# Patient Record
Sex: Male | Born: 1945 | Race: White | Hispanic: No | Smoking: Current every day smoker
Health system: Southern US, Community
[De-identification: ages and names within clinical notes are randomized; demographics above are authoritative.]

---

## 2010-02-06 ENCOUNTER — Emergency Department (HOSPITAL_COMMUNITY): Admission: EM | Admit: 2010-02-06 | Discharge: 2010-02-06 | Payer: Self-pay | Admitting: Emergency Medicine

## 2011-07-30 ENCOUNTER — Emergency Department (HOSPITAL_COMMUNITY)
Admission: EM | Admit: 2011-07-30 | Discharge: 2011-07-30 | Disposition: A | Discharge status: Home / Self Care | Payer: Medicare Other | Attending: Emergency Medicine | Admitting: Emergency Medicine

## 2011-07-30 ENCOUNTER — Encounter (HOSPITAL_COMMUNITY): Payer: Self-pay | Admitting: *Deleted

## 2011-07-30 DIAGNOSIS — L309 Dermatitis, unspecified: Secondary | ICD-10-CM

## 2011-07-30 DIAGNOSIS — Z7982 Long term (current) use of aspirin: Secondary | ICD-10-CM | POA: Insufficient documentation

## 2011-07-30 DIAGNOSIS — F17200 Nicotine dependence, unspecified, uncomplicated: Secondary | ICD-10-CM | POA: Insufficient documentation

## 2011-07-30 DIAGNOSIS — L259 Unspecified contact dermatitis, unspecified cause: Secondary | ICD-10-CM | POA: Insufficient documentation

## 2011-07-30 MED ORDER — TRIAMCINOLONE ACETONIDE 0.1 % EX CREA: TOPICAL_CREAM | Freq: Two times a day (BID) | CUTANEOUS | Status: AC

## 2011-07-30 MED ORDER — PREDNISONE 50 MG PO TABS: 50.0000 mg | ORAL_TABLET | Freq: Every day | ORAL | Status: AC

## 2011-07-30 NOTE — ED Provider Notes (Signed)
History     CSN: [id number]  Arrival date & time 07/30/11  1429   First MD Initiated Contact with Patient 07/30/11 1441      Chief Complaint  Patient presents with  . Poison Ivy    (Consider location/radiation/quality/duration/timing/severity/associated sxs/prior treatment) HPI... rash on right hand for approximately one week. Patient states exposure to poison ivy. No radiation of rash. Symptoms are moderate. Rash is worsened by scratching  History reviewed. No pertinent past medical history.  History reviewed. No pertinent past surgical history.  No family history on file.  History  Substance Use Topics  . Smoking status: Current Everyday Smoker  . Smokeless tobacco: Not on file  . Alcohol Use: No      Review of Systems  All other systems reviewed and are negative.    Allergies  Review of patient's allergies indicates no known allergies.  Home Medications   Current Outpatient Rx  Name Route Sig Dispense Refill  . ASPIRIN EC 81 MG PO TBEC Oral Take 81 mg by mouth every morning.    Marland Kitchen PREDNISONE 50 MG PO TABS Oral Take 1 tablet (50 mg total) by mouth daily. 7 tablet 1  . TRIAMCINOLONE ACETONIDE 0.1 % EX CREA Topical Apply topically 2 (two) times daily. 30 g 1    BP 215/110  Pulse 99  Temp(Src) 98.2 F (36.8 C) (Oral)  Resp 20  Ht 5\' 9"  (1.753 m)  Wt 180 lb (81.647 kg)  BMI 26.58 kg/m2  SpO2 98%  Physical Exam  Constitutional: He is oriented to person, place, and time. He appears well-developed and well-nourished.       Blood pressure elevated  HENT:  Head: Normocephalic.  Eyes: Conjunctivae are normal.  Musculoskeletal: Normal range of motion.  Neurological: He is alert and oriented to person, place, and time.  Skin:       Papular excoriation throughout entire right hand  Psychiatric: He has a normal mood and affect.    ED Course  Procedures (including critical care time)  Labs Reviewed - No data to display No results found.   1. Dermatitis        MDM  History and physical consistent with contact dermatitis secondary to poison ivy exposure.  Rx prednisone and Aristocort ointment.  Elevated blood pressure noted. Patient was notified of same        Donnetta Hutching, MD 07/30/11 1510

## 2011-07-30 NOTE — ED Notes (Signed)
Pt state that he was working out in the yard on Monday, had gloves on that was exposed to poison oak, pt has dry cracked areas initial the area was rash covered, itching, pt denies any other exposure.

## 2011-07-30 NOTE — Discharge Instructions (Signed)
Contact Dermatitis Contact dermatitis is a rash that happens when something touches the skin. You touched something that irritates your skin, or you have allergies to something you touched. HOME CARE   Avoid the thing that caused your rash.   Keep your rash away from hot water, soap, sunlight, chemicals, and other things that might bother it.   Do not scratch your rash.   You can take cool baths to help stop itching.   Only take medicine as told by your doctor.   Keep all doctor visits as told.  GET HELP RIGHT AWAY IF:   Your rash is not better after 3 days.   Your rash gets worse.   Your rash is puffy (swollen), tender, red, sore, or warm.   You have problems with your medicine.  MAKE SURE YOU:   Understand these instructions.   Will watch your condition.   Will get help right away if you are not doing well or get worse.  Document Released: 01/24/2009 Document Revised: 03/18/2011 Document Reviewed: 09/01/2010 [facility] Patient Information 2012 [place], [place].  Prednisone tablets and steroid ointment for hands.   Your blood pressure is quite elevated today. Please have her rechecked several times and documented. This will need physician followup.

## 2013-04-27 ENCOUNTER — Ambulatory Visit: Payer: Self-pay | Admitting: Family Medicine

## 2014-10-18 ENCOUNTER — Encounter (HOSPITAL_COMMUNITY): Payer: Self-pay | Admitting: *Deleted

## 2014-10-18 ENCOUNTER — Emergency Department (HOSPITAL_COMMUNITY): Payer: Medicare Other

## 2014-10-18 ENCOUNTER — Emergency Department (HOSPITAL_COMMUNITY)
Admission: EM | Admit: 2014-10-18 | Discharge: 2014-11-11 | Disposition: E | Payer: Medicare Other | Attending: Emergency Medicine | Admitting: Emergency Medicine

## 2014-10-18 DIAGNOSIS — Z720 Tobacco use: Secondary | ICD-10-CM | POA: Diagnosis not present

## 2014-10-18 DIAGNOSIS — R140 Abdominal distension (gaseous): Secondary | ICD-10-CM | POA: Diagnosis not present

## 2014-10-18 DIAGNOSIS — Z7982 Long term (current) use of aspirin: Secondary | ICD-10-CM | POA: Diagnosis not present

## 2014-10-18 DIAGNOSIS — I469 Cardiac arrest, cause unspecified: Secondary | ICD-10-CM | POA: Diagnosis not present

## 2014-10-18 DIAGNOSIS — J9600 Acute respiratory failure, unspecified whether with hypoxia or hypercapnia: Secondary | ICD-10-CM

## 2014-10-18 DIAGNOSIS — R4182 Altered mental status, unspecified: Secondary | ICD-10-CM

## 2014-10-18 DIAGNOSIS — R609 Edema, unspecified: Secondary | ICD-10-CM | POA: Diagnosis not present

## 2014-10-18 DIAGNOSIS — I499 Cardiac arrhythmia, unspecified: Secondary | ICD-10-CM | POA: Diagnosis not present

## 2014-10-18 DIAGNOSIS — R231 Pallor: Secondary | ICD-10-CM | POA: Diagnosis not present

## 2014-10-18 LAB — CBC WITH DIFFERENTIAL/PLATELET
Basophils Absolute: 0 10*3/uL (ref 0.0–0.1)
Basophils Relative: 0 % (ref 0–1)
Eosinophils Absolute: 0 10*3/uL (ref 0.0–0.7)
Eosinophils Relative: 0 % (ref 0–5)
HCT: 43 % (ref 39.0–52.0)
Hemoglobin: 14.1 g/dL (ref 13.0–17.0)
Lymphocytes Relative: 12 % (ref 12–46)
Lymphs Abs: 1.6 10*3/uL (ref 0.7–4.0)
MCH: 31 pg (ref 26.0–34.0)
MCHC: 32.8 g/dL (ref 30.0–36.0)
MCV: 94.5 fL (ref 78.0–100.0)
Monocytes Absolute: 0.8 10*3/uL (ref 0.1–1.0)
Monocytes Relative: 6 % (ref 3–12)
Neutro Abs: 11.1 10*3/uL — ABNORMAL HIGH (ref 1.7–7.7)
Neutrophils Relative %: 82 % — ABNORMAL HIGH (ref 43–77)
Platelets: 210 10*3/uL (ref 150–400)
RBC: 4.55 MIL/uL (ref 4.22–5.81)
RDW: 17.4 % — ABNORMAL HIGH (ref 11.5–15.5)
WBC: 13.5 10*3/uL — ABNORMAL HIGH (ref 4.0–10.5)

## 2014-10-18 LAB — MAGNESIUM: Magnesium: 2.3 mg/dL (ref 1.7–2.4)

## 2014-10-18 LAB — COMPREHENSIVE METABOLIC PANEL
ALT: 12 U/L — ABNORMAL LOW (ref 17–63)
AST: 31 U/L (ref 15–41)
Albumin: 2.1 g/dL — ABNORMAL LOW (ref 3.5–5.0)
Alkaline Phosphatase: 81 U/L (ref 38–126)
Anion gap: 16 — ABNORMAL HIGH (ref 5–15)
BUN: 35 mg/dL — ABNORMAL HIGH (ref 6–20)
CO2: 19 mmol/L — ABNORMAL LOW (ref 22–32)
Calcium: 8.1 mg/dL — ABNORMAL LOW (ref 8.9–10.3)
Chloride: 103 mmol/L (ref 101–111)
Creatinine, Ser: 1.64 mg/dL — ABNORMAL HIGH (ref 0.61–1.24)
GFR calc Af Amer: 47 mL/min — ABNORMAL LOW (ref >60)
GFR calc non Af Amer: 40 mL/min — ABNORMAL LOW (ref >60)
Glucose, Bld: 109 mg/dL — ABNORMAL HIGH (ref 65–99)
Potassium: 4.3 mmol/L (ref 3.5–5.1)
Sodium: 138 mmol/L (ref 135–145)
Total Bilirubin: 2.6 mg/dL — ABNORMAL HIGH (ref 0.3–1.2)
Total Protein: 6.2 g/dL — ABNORMAL LOW (ref 6.5–8.1)

## 2014-10-18 LAB — I-STAT TROPONIN, ED: Troponin i, poc: 1.37 ng/mL (ref 0.00–0.08)

## 2014-10-18 LAB — BLOOD GAS, ARTERIAL
Acid-base deficit: 14 mmol/L — ABNORMAL HIGH (ref 0.0–2.0)
Bicarbonate: 12.5 mEq/L — ABNORMAL LOW (ref 20.0–24.0)
FIO2: 100 %
MECHVT: 450 mL
O2 Saturation: 89.3 %
PEEP: 5 cmH2O
Patient temperature: 37
RATE: 16 resp/min
TCO2: 12.5 mmol/L (ref 0–100)
pCO2 arterial: 33.4 mmHg — ABNORMAL LOW (ref 35.0–45.0)
pH, Arterial: 7.198 — CL (ref 7.350–7.450)
pO2, Arterial: 79.1 mmHg — ABNORMAL LOW (ref 80.0–100.0)

## 2014-10-18 LAB — TROPONIN I: Troponin I: 1.11 ng/mL (ref <0.031)

## 2014-10-18 LAB — PROTIME-INR
INR: 1.65 — ABNORMAL HIGH (ref 0.00–1.49)
Prothrombin Time: 19.5 seconds — ABNORMAL HIGH (ref 11.6–15.2)

## 2014-10-18 LAB — CK: Total CK: 187 U/L (ref 49–397)

## 2014-10-18 MED ORDER — PIPERACILLIN-TAZOBACTAM 3.375 G IVPB 30 MIN
3.3750 g | Freq: Once | INTRAVENOUS | Status: DC
  Filled 2014-10-18: qty 50

## 2014-10-18 MED ORDER — SODIUM BICARBONATE 8.4 % IV SOLN
INTRAVENOUS | Status: AC | PRN
  Administered 2014-10-18 (×2): 50 meq via INTRAVENOUS

## 2014-10-18 MED ORDER — ETOMIDATE 2 MG/ML IV SOLN
INTRAVENOUS | Status: AC
  Administered 2014-10-18: 20 mg
  Filled 2014-10-18: qty 20

## 2014-10-18 MED ORDER — ROCURONIUM BROMIDE 50 MG/5ML IV SOLN
INTRAVENOUS | Status: AC
  Administered 2014-10-18: 80 mg
  Filled 2014-10-18: qty 2

## 2014-10-18 MED ORDER — EPINEPHRINE HCL 0.1 MG/ML IJ SOSY
PREFILLED_SYRINGE | INTRAMUSCULAR | Status: AC | PRN
  Administered 2014-10-18 (×4): 0.1 mg via INTRAVENOUS

## 2014-10-18 MED ORDER — SUCCINYLCHOLINE CHLORIDE 20 MG/ML IJ SOLN
INTRAMUSCULAR | Status: AC
  Filled 2014-10-18: qty 1

## 2014-10-18 MED ORDER — VANCOMYCIN HCL IN DEXTROSE 1-5 GM/200ML-% IV SOLN
1000.0000 mg | Freq: Once | INTRAVENOUS | Status: DC
  Filled 2014-10-18: qty 200

## 2014-10-18 MED ORDER — PROPOFOL 1000 MG/100ML IV EMUL
INTRAVENOUS | Status: AC
  Filled 2014-10-18: qty 100

## 2014-10-18 MED ORDER — PROPOFOL 1000 MG/100ML IV EMUL
5.0000 ug/kg/min | INTRAVENOUS | Status: DC
  Administered 2014-10-18: 5 ug/kg/min via INTRAVENOUS

## 2014-10-18 MED ORDER — VECURONIUM BROMIDE 10 MG IV SOLR
INTRAVENOUS | Status: AC
  Filled 2014-10-18: qty 10

## 2014-10-18 MED ORDER — SODIUM CHLORIDE 0.9 % IV SOLN
INTRAVENOUS | Status: DC
  Administered 2014-10-18: 999 mL via INTRAVENOUS

## 2014-10-18 MED ORDER — LIDOCAINE HCL (CARDIAC) 20 MG/ML IV SOLN
INTRAVENOUS | Status: AC
  Filled 2014-10-18: qty 5

## 2014-10-25 MED FILL — Medication: Qty: 1 | Status: AC

## 2014-11-11 NOTE — Code Documentation (Signed)
Pulse brady down. CPR started

## 2014-11-11 NOTE — ED Notes (Signed)
Multiple O2 sat monitor sites tried including finger, ear, forehead.  Pt is cold to touch.  Unable to obtain accurate sat.  MD aware.  Also unable to insert foley.  Pt has hypospadias present with very small urethra.  Unable to insert 47F.  Will bladder scan.

## 2014-11-11 NOTE — ED Notes (Signed)
Brought in via ems, altered LOC, DYSPNEA ON ARRIVAL

## 2014-11-11 NOTE — ED Notes (Signed)
CRITICAL VALUE ALERT  Critical value received: Troponin 1.11  Date of notification:  01/25/2015  Time of notification:  1535  Critical value read back:Yes.    Nurse who received alert:  Berdine DanceMandi Emberlee Sortino RN  MD notified (1st page):  Juleen ChinaKohut  Time of first page:  1535  MD notified (2nd page):  Time of second page:  Responding MD:  Juleen ChinaKohut  Time MD responded:  [phone]

## 2014-11-11 NOTE — Code Documentation (Signed)
Rhythm check ST at 108 with weak femoral pulse felt. Pt already intubated prior to CODE.

## 2014-11-11 NOTE — Code Documentation (Signed)
CPR started. Family decided to stay in room, family being comforted by staff also. Dr Juleen ChinaKohut at bedside.

## 2014-11-11 NOTE — ED Notes (Signed)
Noted rhythm change on monitor.  Pts rate began to drop.  Dr Juleen ChinaKohut made aware and came immediately to bedside.  CPR began.  Family decided to stay at bedside during code.

## 2014-11-11 NOTE — ED Notes (Signed)
CRITICAL VALUE ALERT  Critical value received:  ABG results, ph 7.198, pCO2 33.4, pO2 79.1, Bicarb 12.5, Sat 89%   Date of notification:  December 28, 2014  Time of notification:  1500  Critical value read back:Yes.    Nurse who received alert:  Berdine DanceMandi Senita Corredor RN  MD notified (1st page):  Juleen ChinaKohut  Time of first page:  1500  MD notified (2nd page):  Time of second page:  Responding MD:  Juleen ChinaKohut  Time MD responded:  1500

## 2014-11-11 NOTE — ED Notes (Signed)
Dr. Juleen ChinaKohut notified of I-Stat troponin 1.37.

## 2014-11-11 NOTE — ED Provider Notes (Signed)
CSN: [id number]     Arrival date & time Nov 13, 2014  1404 History   First MD Initiated Contact with Patient 13-Nov-2014 1406     Chief Complaint  Patient presents with  . Altered Mental Status     (Consider location/radiation/quality/duration/timing/severity/associated sxs/prior Treatment) HPI   69yM brought in by EMS in extremis. Pt has GCS of 8 and unable to provide any history. Little in terms of past history or prior records. Pt apparently did not have regular medical care. Daughter reports long history of alcohol abuse, but stopped drinking a couple years ago. Continues to smoke daily. Progressive decline in function since this past Spring and acutely worse in past week. Patient has not left his room in several days. Family called EMS earlier this week, but he refused transfer for evaluation. Continued to tell them not to call until today when he was unable to do more than mumble. No trauma that they are area of. Pt did not voice specific complaints. Said he just wanted to be left alone whenever they checked on him.   No past medical history on file. No past surgical history on file. No family history on file. History  Substance Use Topics  . Smoking status: Current Every Day Smoker  . Smokeless tobacco: Not on file  . Alcohol Use: Yes    Review of Systems  Level 5 caveat because pt in nonverbal.    Allergies  Review of patient's allergies indicates no known allergies.  Home Medications   Prior to Admission medications   Medication Sig Start Date End Date Taking? Authorizing Provider  aspirin EC 81 MG tablet Take 81 mg by mouth every morning.    Historical Provider, MD   BP 121/85 mmHg  Pulse 125  Temp(Src) 97.7 F (36.5 C) (Rectal)  Resp 25 Physical Exam  Constitutional: He appears distressed.  HENT:  Head: Atraumatic.  Eyes:  scleral icterus  Cardiovascular:  Tachycardic. Irregular.   Pulmonary/Chest: He is in respiratory distress.  Abdominal: He exhibits  distension.  Distended, but soft. Fluid wave.   Musculoskeletal: He exhibits edema.  Neurological: GCS eye subscore is 2. GCS verbal subscore is 2. GCS motor subscore is 4.  Moves all extremities  Skin: There is pallor.  Extremities cool. Mottled appearance of abdomen and lower extremities.   Nursing note and vitals reviewed.   ED Course  OG placement Date/Time: 2014/11/13 2:10 PM Performed by: Raeford Razor Authorized by: Raeford Razor Consent: The procedure was performed in an emergent situation. Patient identity confirmed: arm band and provided demographic data Patient sedated: yes Vitals: Vital signs were monitored during sedation. Patient tolerance: Patient tolerated the procedure well with no immediate complications   INTUBATION Performed by: Raeford Razor  Required items: required blood products, implants, devices, and special equipment available Patient identity confirmed: provided demographic data and hospital-assigned identification number Time out: Immediately prior to procedure a "time out" was called to verify the correct patient, procedure, equipment, support staff and site/side marked as required.  Indications: respiratory failure. Airway protection  Intubation method: Direct laryngoscopy. Mac 3.   Preoxygenation: BVM  Sedatives: Etomidate Paralytic: Rocuronium  Tube Size: 7.5 cuffed  Post-procedure assessment: chest rise and ETCO2 monitor Breath sounds: equal and absent over the epigastrium Tube secured with: ETT holder Chest x-ray interpreted by radiologist and me.  Chest x-ray findings: endotracheal tube in appropriate position  Patient tolerated the procedure well with no immediate complications.  CRITICAL CARE Performed by: Raeford Razor Total critical care time: 60 minutes  Critical care time was exclusive of separately billable procedures and treating other patients. Critical care was necessary to treat or prevent imminent or  life-threatening deterioration. Critical care was time spent personally by me on the following activities: development of treatment plan with patient and/or surrogate as well as nursing, discussions with consultants, evaluation of patient's response to treatment, examination of patient, obtaining history from patient or surrogate, ordering and performing treatments and interventions, ordering and review of laboratory studies, ordering and review of radiographic studies, pulse oximetry and re-evaluation of patient's condition.   Cardiopulmonary Resuscitation (CPR) Procedure Note Directed/Performed by: Raeford RazorKOHUT, [employer] I personally directed ancillary staff and/or performed CPR in an effort to regain return of spontaneous circulation and to maintain cardiac, neuro and systemic perfusion.     Labs Review Labs Reviewed  BLOOD GAS, ARTERIAL - Abnormal; Notable for the following:    pH, Arterial 7.198 (*)    pCO2 arterial 33.4 (*)    pO2, Arterial 79.1 (*)    Bicarbonate 12.5 (*)    Acid-base deficit 14.0 (*)    All other components within normal limits  CBC WITH DIFFERENTIAL/PLATELET - Abnormal; Notable for the following:    WBC 13.5 (*)    RDW 17.4 (*)    Neutrophils Relative % 82 (*)    Neutro Abs 11.1 (*)    All other components within normal limits  COMPREHENSIVE METABOLIC PANEL - Abnormal; Notable for the following:    CO2 19 (*)    Glucose, Bld 109 (*)    BUN 35 (*)    Creatinine, Ser 1.64 (*)    Calcium 8.1 (*)    Total Protein 6.2 (*)    Albumin 2.1 (*)    ALT 12 (*)    Total Bilirubin 2.6 (*)    GFR calc non Af Amer 40 (*)    GFR calc Af Amer 47 (*)    Anion gap 16 (*)    All other components within normal limits  TROPONIN I - Abnormal; Notable for the following:    Troponin I 1.11 (*)    All other components within normal limits  PROTIME-INR - Abnormal; Notable for the following:    Prothrombin Time 19.5 (*)    INR 1.65 (*)    All other components within normal limits   I-STAT TROPOININ, ED - Abnormal; Notable for the following:    Troponin i, poc 1.37 (*)    All other components within normal limits  CULTURE, BLOOD (ROUTINE X 2)  CULTURE, BLOOD (ROUTINE X 2)  MAGNESIUM  CK  LACTIC ACID, PLASMA  LACTIC ACID, PLASMA  URINALYSIS, ROUTINE W REFLEX MICROSCOPIC (NOT AT [facility])  AMMONIA    Imaging Review Ct Head Wo Contrast  10/24/2014   CLINICAL DATA:  Altered mental status today.  Initial encounter.  EXAM: CT HEAD WITHOUT CONTRAST  TECHNIQUE: Contiguous axial images were obtained from the base of the skull through the vertex without intravenous contrast.  COMPARISON:  None.  FINDINGS: There is atrophy, chronic microvascular ischemic change and a remote PCA territory infarct. Remote lacunar infarction in the left basal ganglia is also identified. Small lacunar infarcts are also seen in the left and right thalamus and in the high left and right parietal lobe. No evidence of acute intracranial abnormality including hemorrhage, infarct, mass lesion, mass effect, midline shift or abnormal extra-axial fluid collection is identified. There is no hydrocephalus or pneumocephalus.  IMPRESSION: No acute finding.  Atrophy, extensive chronic microvascular ischemic change remote right PCA territory  infarct.   Electronically Signed   By: Drusilla Kanner M.D.   On: 10-25-2014 15:41   Dg Chest Portable 1 View  Oct 25, 2014   CLINICAL DATA:  Altered mental status.  Shortness of breath.  EXAM: PORTABLE CHEST - 1 VIEW  COMPARISON:  None.  FINDINGS: Endotracheal tube is in place with the tip 2.3 cm above the carina. NG tube courses into the stomach and below the inferior margin of film. There is complete whiteout of the right chest. The left lung appears clear. The cardiac silhouette is obscured.  IMPRESSION: ETT tip is 2.3 cm above the carina.  Complete whiteout of the right chest could be due to mass, pneumonia and/or pleural effusion.   Electronically Signed   By: Drusilla Kanner M.D.    On: 2014-10-25 14:52     EKG Interpretation   Date/Time:  Friday 10-25-2014 14:07:35 EDT Ventricular Rate:  131 PR Interval:    QRS Duration: 166 QT Interval:  414 QTC Calculation: 611 R Axis:   -21 Text Interpretation:  Atrial fibrillation/conduction delay versus v-tach  Nonspecific intraventricular conduction delay Anterior infarct, old  baseline wander in multiple leads No old tracing to compare Confirmed by  Terricka Onofrio  MD, Daphna Lafuente ([id number]) on 2014/10/25 4:20:06 PM      MDM   Final diagnoses:  Acute respiratory failure, unspecified whether with hypoxia or hypercapnia  Altered mental status, unspecified altered mental status type  Cardiac arrest    69yM with altered mental status. Tachypnea in 40s/hypoxic/GCS 8. Intubated for respiratory failure/airway protection. Wide differential. No reported trauma. Consider infectious. Afebrile rectally, but given degree of illness, empiric abx ordered. UA, lactic acid, blood cultures. Tox. No reported ingestion. Hx of ETOH abuse for years, but family reports has not drank in a couple years. Probably cirrhotic. Ammonia, INR. Belly distended, but soft. Didn't seem tender but exam unreliable with his mental status. No overt bleeding/melena. Consider cardiac. Istat troponin is elevated. EKG with afib with conduction delay versus tach. Appears more irregular on exam/monitor. Good blood pressure. May be demand ischemia from hypoxemia versus CAD. No old EKG for comparison. Antiarrhythmic deferred with good pressure. Will give rectal ASA if no significant anemia/thrombocytopenia and once view CT head. Less likely CVA. Consider primary pulmonary issue. Hx of COPD. CXR as above. Check ABG. Needs admit. Antic[facility]ate [facility].    Unable to pass foley. Hypospadias and suspect may have urethral stricture.    4:12 PM Called to bedside because pt became very bradycardic. No pulse on my exam. Diprivan turned off. Began bagging. CPR initiated. 2 amps bicarb  with level of acidemia noted on ABG. Briefly regained pulses, then increasingly brady and PEA. Multiple family members at bedside. They made decision to discontinue CPR after ~ 10 minutes. Time of death 17.   Raeford Razor, MD 10/25/2014 [phone]

## 2014-11-11 DEATH — deceased

## 2016-12-09 IMAGING — CR DG CHEST 1V PORT
1 series · 1 of 1 positions shown · non-contrast
Comparison: None.

CLINICAL DATA: Altered mental status.  Shortness of breath.

EXAM:
PORTABLE CHEST - 1 VIEW

[ap portable]
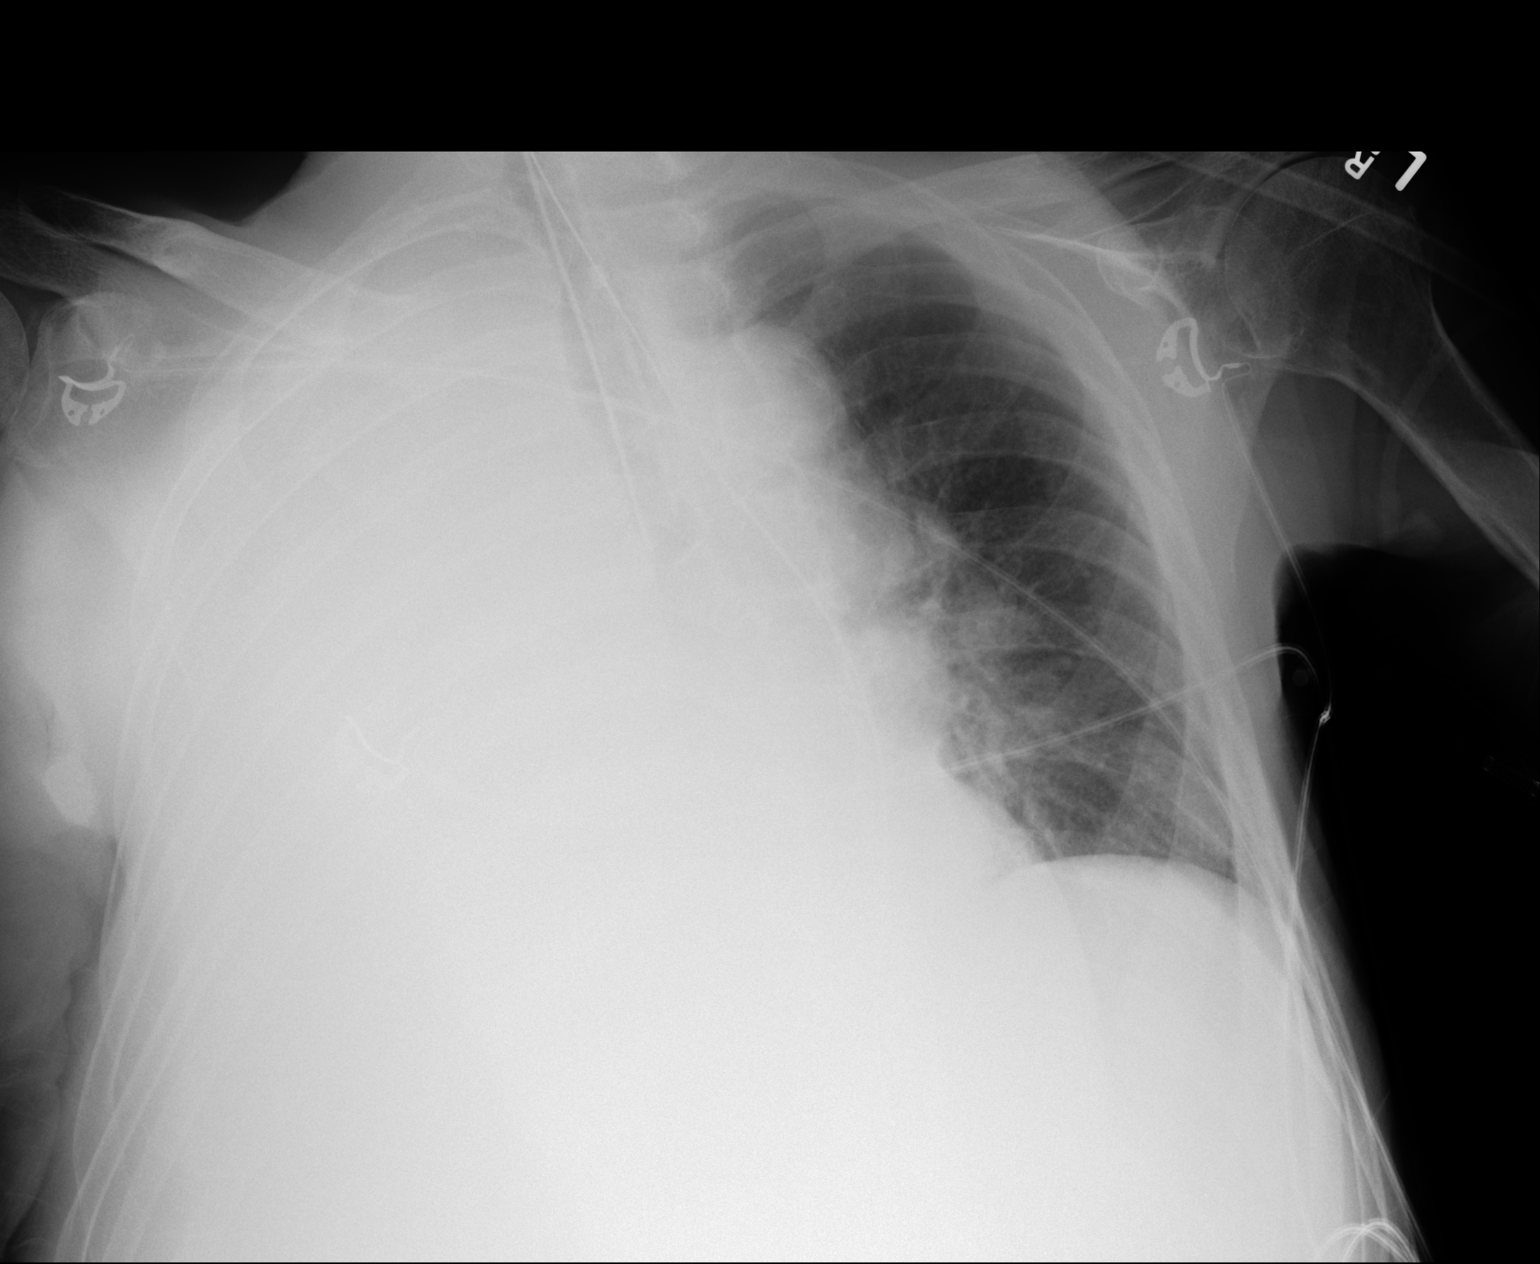

[1 of 1 positions shown; findings below may reference images not displayed]

FINDINGS: Endotracheal tube is in place with the tip 2.3 cm above the carina.
NG tube courses into the stomach and below the inferior margin of
film. There is complete whiteout of the right chest. The left lung
appears clear. The cardiac silhouette is obscured.
IMPRESSION: ETT tip is 2.3 cm above the carina.

Complete whiteout of the right chest could be due to mass, pneumonia
and/or pleural effusion.

## 2016-12-09 IMAGING — CT CT HEAD W/O CM
1 series · 16 of 30 positions shown, 20 images · non-contrast
Comparison: None.

CLINICAL DATA: Altered mental status today.  Initial encounter.

EXAM:
CT HEAD WITHOUT CONTRAST
TECHNIQUE: Contiguous axial images were obtained from the base of the skull
through the vertex without intravenous contrast.

[Series 2: headseq 4.8 h37s · axial · 0.50mm/px · z∈[+143,+330]mm · 16 of 42 slices shown, 20 images]
[im 2/42  brain]
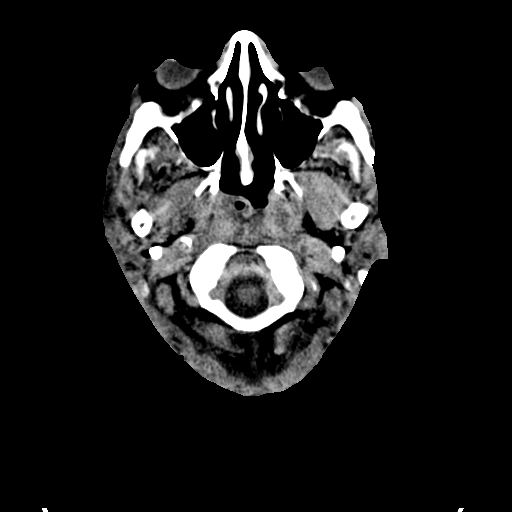
[im 2/42  bone]
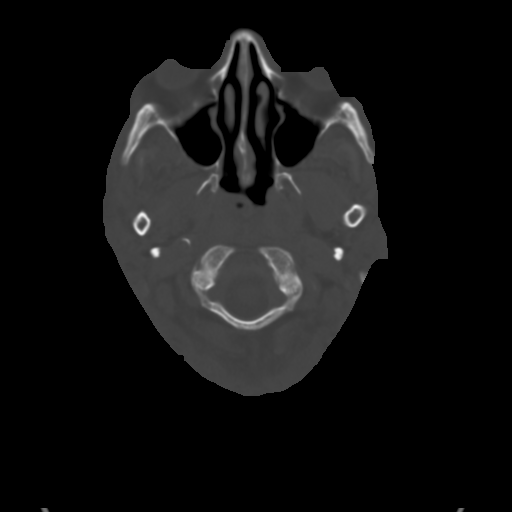
[im 5/42  brain]
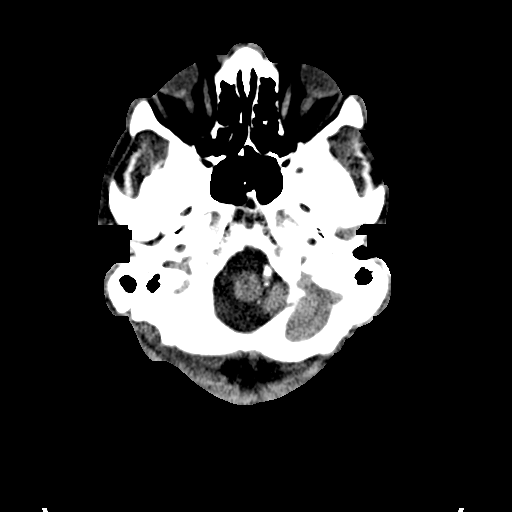
[im 8/42  brain]
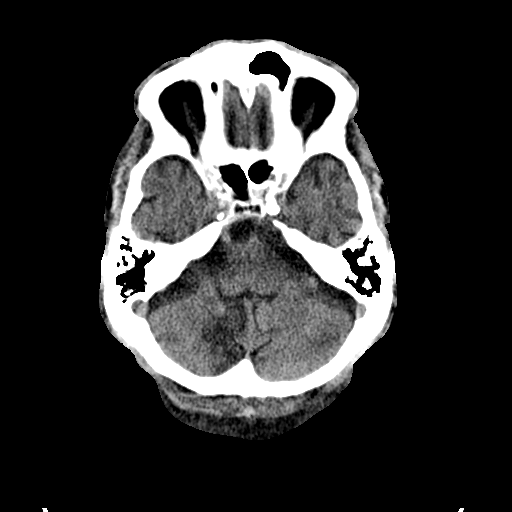
[im 10/42  brain]
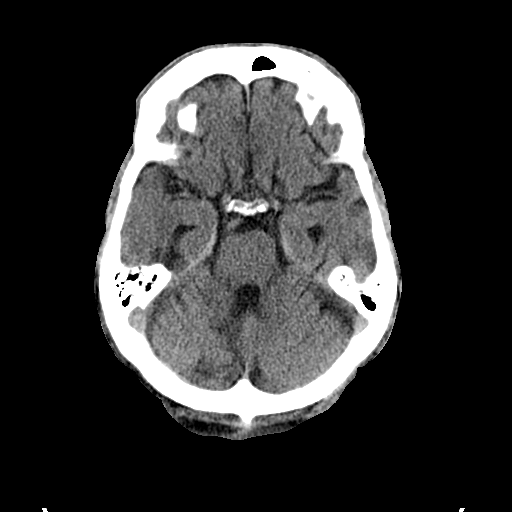
[im 12/42  brain]
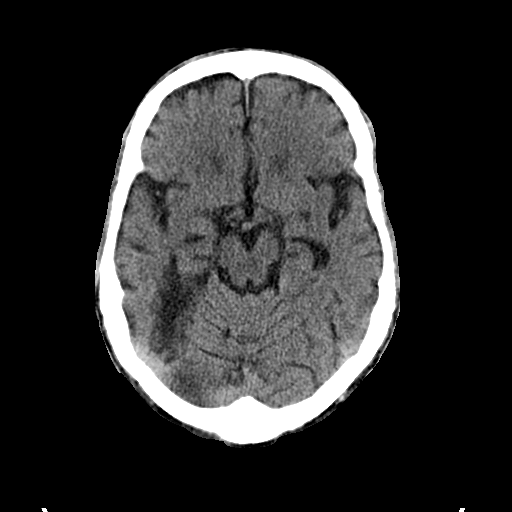
[im 12/42  bone]
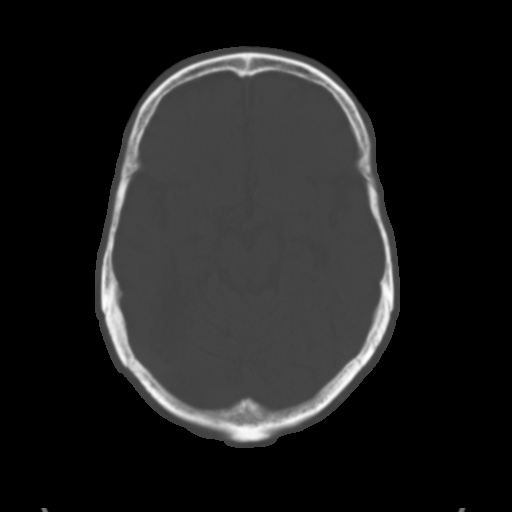
[im 15/42  brain]
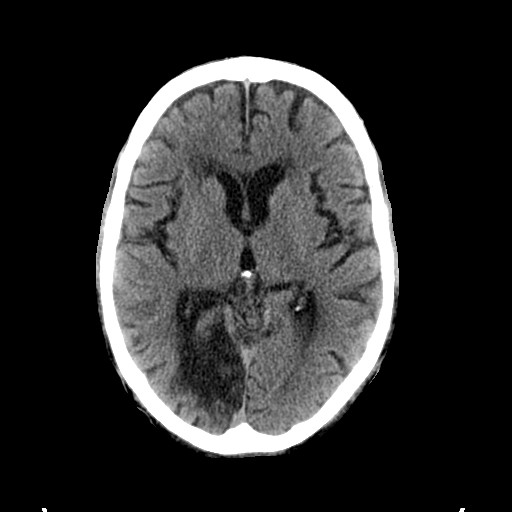
[im 17/42  brain]
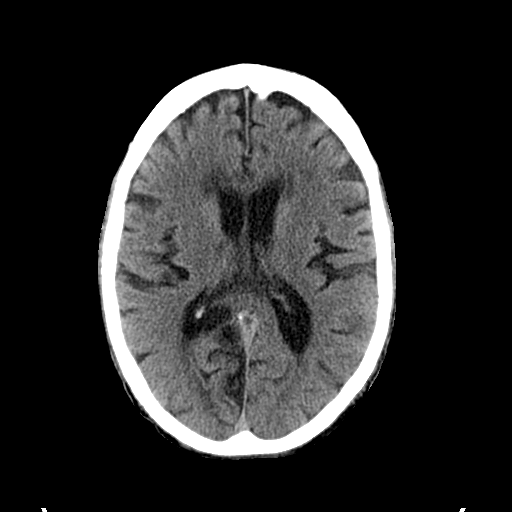
[im 20/42  brain]
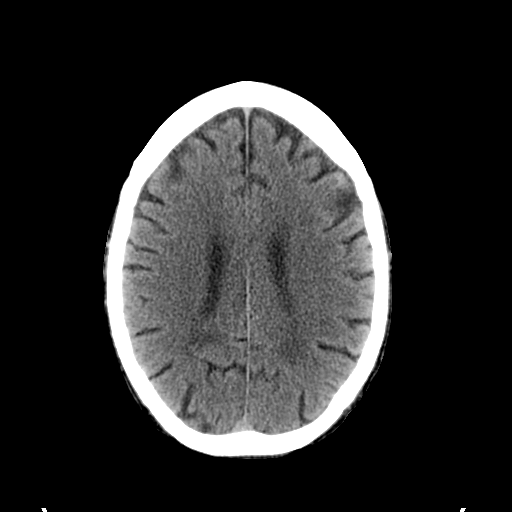
[im 22/42  brain]
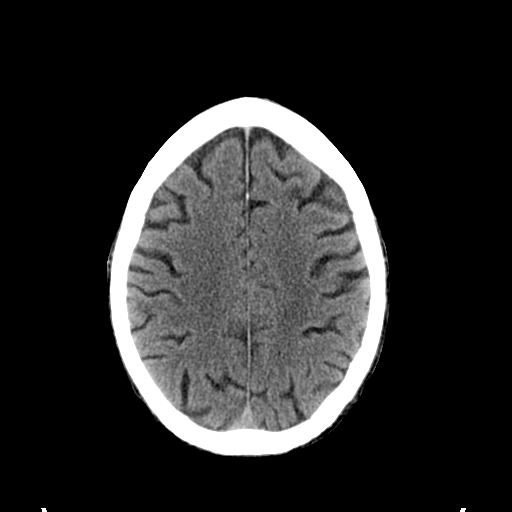
[im 22/42  bone]
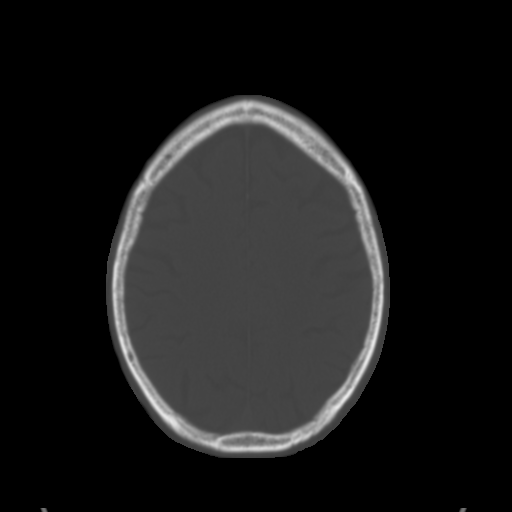
[im 25/42  brain]
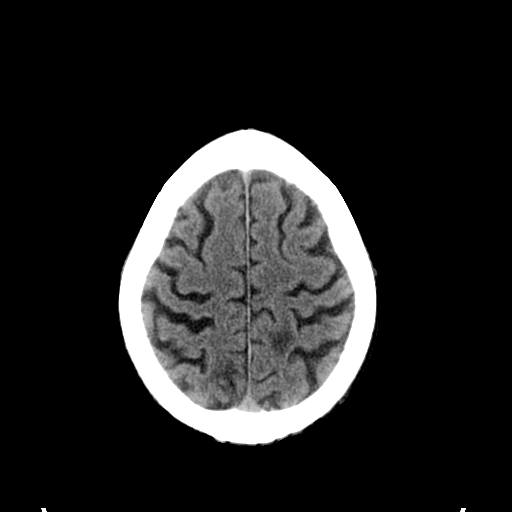
[im 27/42  brain]
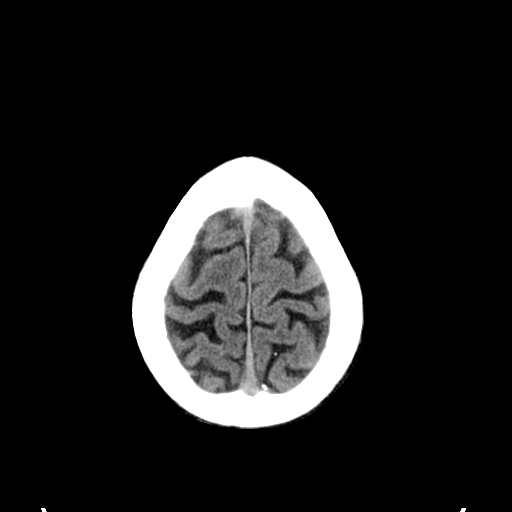
[im 30/42  brain]
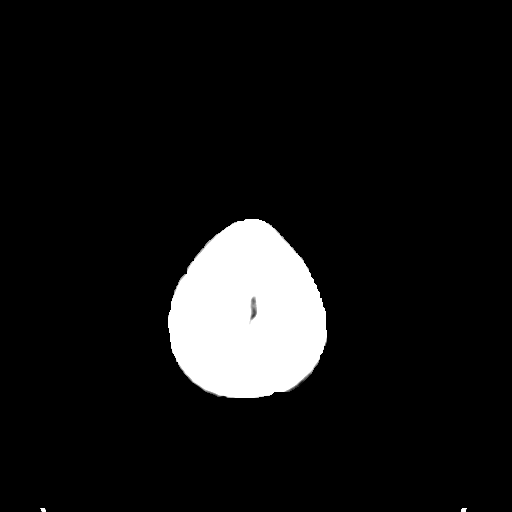
[im 32/42  brain]
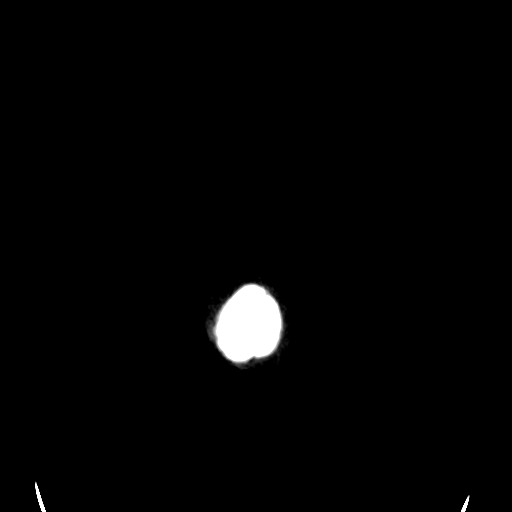
[im 32/42  bone]
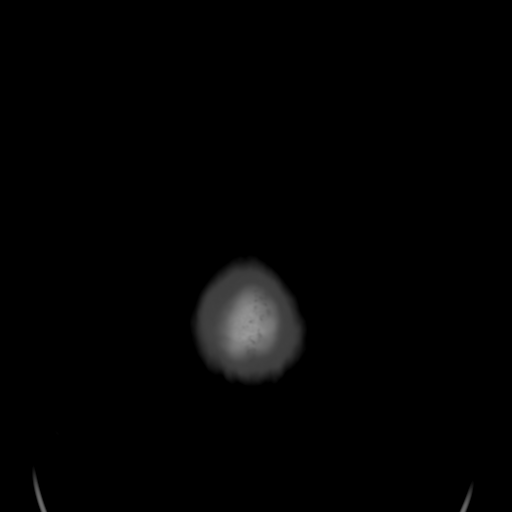
[im 34/42  brain]
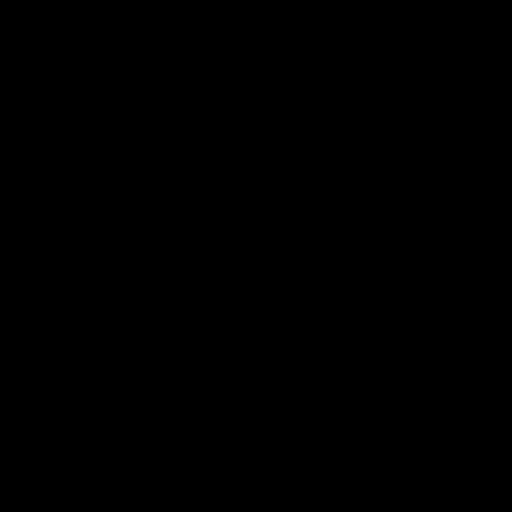
[im 37/42  brain]
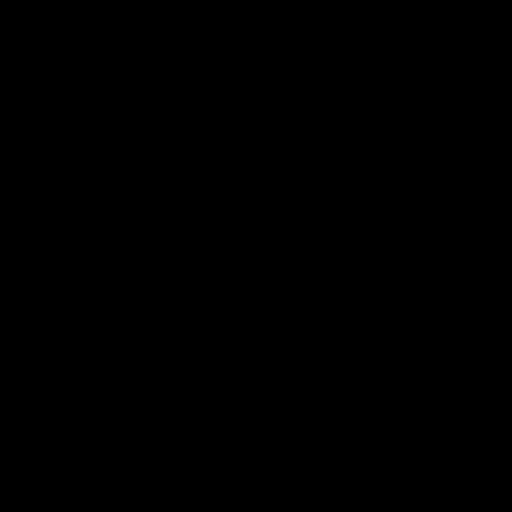
[im 40/42  brain]
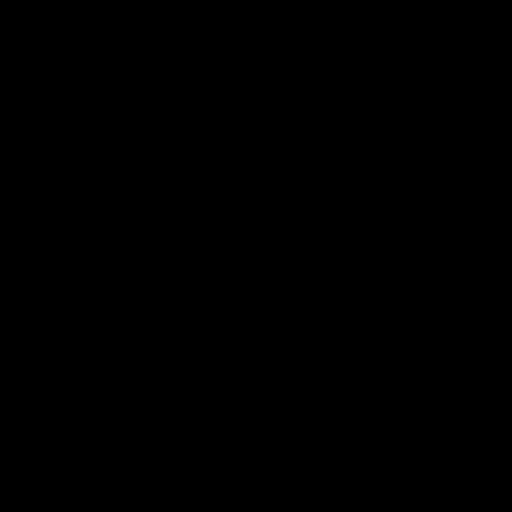

[16 of 30 positions shown; findings below may reference images not displayed]

FINDINGS: There is atrophy, chronic microvascular ischemic change and a remote
PCA territory infarct. Remote lacunar infarction in the left basal
ganglia is also identified. Small lacunar infarcts are also seen in
the left and right thalamus and in the high left and right parietal
lobe. No evidence of acute intracranial abnormality including
hemorrhage, infarct, mass lesion, mass effect, midline shift or
abnormal extra-axial fluid collection is identified. There is no
hydrocephalus or pneumocephalus.
IMPRESSION: No acute finding.

Atrophy, extensive chronic microvascular ischemic change remote
right PCA territory infarct.
